# Patient Record
Sex: Female | Born: 1935 | Race: White | Hispanic: No | Marital: Married | Smoking: Never smoker
Health system: Southern US, Community
[De-identification: ages and names within clinical notes are randomized; demographics above are authoritative.]

## PROBLEM LIST (undated history)

## (undated) DIAGNOSIS — F419 Anxiety disorder, unspecified: Secondary | ICD-10-CM

## (undated) DIAGNOSIS — M109 Gout, unspecified: Secondary | ICD-10-CM

## (undated) DIAGNOSIS — G629 Polyneuropathy, unspecified: Secondary | ICD-10-CM

## (undated) DIAGNOSIS — I10 Essential (primary) hypertension: Secondary | ICD-10-CM

## (undated) DIAGNOSIS — M1990 Unspecified osteoarthritis, unspecified site: Secondary | ICD-10-CM

## (undated) HISTORY — PX: FOOT BONE EXCISION: SUR493

## (undated) HISTORY — PX: ABDOMINAL HYSTERECTOMY: SHX81

## (undated) HISTORY — PX: JOINT REPLACEMENT: SHX530

## (undated) HISTORY — PX: BACK SURGERY: SHX140

## (undated) HISTORY — DX: Polyneuropathy, unspecified: G62.9

---

## 1999-12-20 ENCOUNTER — Ambulatory Visit (HOSPITAL_BASED_OUTPATIENT_CLINIC_OR_DEPARTMENT_OTHER): Admission: RE | Admit: 1999-12-20 | Discharge: 1999-12-20 | Payer: Self-pay | Admitting: Orthopedic Surgery

## 2012-10-28 ENCOUNTER — Other Ambulatory Visit: Payer: Self-pay | Admitting: *Deleted

## 2012-10-28 DIAGNOSIS — M79609 Pain in unspecified limb: Secondary | ICD-10-CM

## 2012-10-28 DIAGNOSIS — I83893 Varicose veins of bilateral lower extremities with other complications: Secondary | ICD-10-CM

## 2012-10-30 ENCOUNTER — Encounter: Payer: Self-pay | Admitting: Surgery

## 2012-11-29 ENCOUNTER — Encounter: Payer: Self-pay | Admitting: Surgery

## 2012-12-02 ENCOUNTER — Encounter: Payer: Self-pay | Admitting: Surgery

## 2012-12-02 ENCOUNTER — Ambulatory Visit (HOSPITAL_COMMUNITY)
Admission: RE | Admit: 2012-12-02 | Discharge: 2012-12-02 | Disposition: A | Discharge status: Home / Self Care | Payer: Medicare Other | Source: Ambulatory Visit | Attending: Surgery | Admitting: Surgery

## 2012-12-02 ENCOUNTER — Ambulatory Visit (INDEPENDENT_AMBULATORY_CARE_PROVIDER_SITE_OTHER): Payer: Medicare Other | Admitting: Surgery

## 2012-12-02 VITALS — BP 175/72 | HR 56 | Ht 63.0 in | Wt 198.0 lb

## 2012-12-02 DIAGNOSIS — M79609 Pain in unspecified limb: Secondary | ICD-10-CM

## 2012-12-02 DIAGNOSIS — I83893 Varicose veins of bilateral lower extremities with other complications: Secondary | ICD-10-CM

## 2012-12-02 NOTE — Progress Notes (Signed)
[facility] [place]   Patient name: Jaclyn Horton MRN: [id number] DOB: [birth year] Sex: female   Referred by: Dr. Renae Fickle  Reason for referral:  Chief Complaint  Patient presents with  . Varicose Veins    new pt, vv's with venous stasis changes    HISTORY OF PRESENT ILLNESS: This is a very pleasant 79 year old gentleman who is referred for bilateral leg pain and swelling.  She states that this has been going on for many years.  She does have multiple orthopedic issues second tributary pain.  She complains of varicose veins in both legs.  She also complains of swelling.  She has trouble walking to the mailbox.  The patient also suffers from neuropathy.  She is multiple orthopedic lower extremity issues.  She has never had a blood clot.  Past Medical History  Diagnosis Date  . Neuropathy     History reviewed. No pertinent past surgical history.  History   Social History  . Marital Status: Married    Spouse Name: N/A    Number of Children: N/A  . Years of Education: N/A   Occupational History  . Not on file.   Social History Main Topics  . Smoking status: Never Smoker   . Smokeless tobacco: Never Used  . Alcohol Use: No  . Drug Use: No  . Sexual Activity: Not on file   Other Topics Concern  . Not on file   Social History Narrative  . No narrative on file    Family History  Problem Relation Age of Onset  . Cancer Mother   . Cancer Father   . Hypertension Sister   . Hyperlipidemia Sister   . Cancer Brother   . Heart attack Brother   . Diabetes Son     Allergies as of 12/02/2012  . (No Known Allergies)    No current outpatient prescriptions on file prior to visit.   No current facility-administered medications on file prior to visit.     REVIEW OF SYSTEMS: Cardiovascular: Positive for pain in legs with walking and lying flat.  Positive for leg swelling and are as veins. Pulmonary: No productive cough, asthma or wheezing. Neurologic:  Positive for weakness in her legs as well as numbness Hematologic: No bleeding problems or clotting disorders. Musculoskeletal: No joint pain or joint swelling. Gastrointestinal: No blood in stool or hematemesis Genitourinary: No dysuria or hematuria. Psychiatric:: No history of major depression. Integumentary: Positive for a rash Constitutional: No fever or chills.  PHYSICAL EXAMINATION: General: The patient appears their stated age.  Vital signs are BP 175/72  Pulse 56  Ht 5\' 3"  (1.6 m)  Wt 198 lb (89.812 kg)  BMI 35.08 kg/m2  SpO2 100% HEENT:  No gross abnormalities Pulmonary: Respirations are non-labored Musculoskeletal: There are no major deformities.   Neurologic: No focal weakness or paresthesias are detected, Skin: There are no ulcer or rashes noted. Psychiatric: The patient has normal affect. Cardiovascular: Palpable pedal pulses bilaterally.  Small varicosities bilaterally.  1+ pitting edema.  Diagnostic Studies: Venous reflux evaluation was performed today.  This was positive for reflux at the saphenofemoral junction bilaterally.  Her great saphenous vein was competent bilaterally.  There was reflux in bilateral short saphenous veins, however the diameter measurements were very small.  The patient also has deep vein reflux with no evidence of obstruction.   Assessment:  Bilateral leg swelling Plan: Base of the patient's ultrasound today, I feel her symptoms are secondary to deep vein insufficiency.  She  does not have incontinence in her great saphenous vein.  There is incompetence of the short saphenous vein however the vein diameter measurements are too small for intervention.  I discussed this with the patient.  I have recommended placing her into 20-30 mm compression stockings for symptom relief.  I do not feel that her symptoms of pain in her feet with walking are related to peripheral vascular disease that she has palpable pulses.  I did discuss with the patient that if  her varicosities become tender or if she has episodes of bleeding that she would be a candidate for stab phlebectomy to have them removed.  This is considered a cosmetic procedure.  The patient will return on an as-needed basis.     Jorge Ny, M.D. [facility] [place] Office: [phone] Pager:  [phone]

## 2012-12-24 ENCOUNTER — Other Ambulatory Visit: Payer: Self-pay | Admitting: Neurosurgery

## 2012-12-24 DIAGNOSIS — M5430 Sciatica, unspecified side: Secondary | ICD-10-CM

## 2012-12-24 DIAGNOSIS — M545 Low back pain, unspecified: Secondary | ICD-10-CM

## 2012-12-25 ENCOUNTER — Other Ambulatory Visit: Payer: No Typology Code available for payment source

## 2012-12-25 ENCOUNTER — Ambulatory Visit
Admission: RE | Admit: 2012-12-25 | Discharge: 2012-12-25 | Disposition: A | Discharge status: Home / Self Care | Payer: Medicare Other | Source: Ambulatory Visit | Attending: Neurosurgery | Admitting: Neurosurgery

## 2012-12-25 DIAGNOSIS — M545 Low back pain: Secondary | ICD-10-CM

## 2012-12-25 DIAGNOSIS — M5430 Sciatica, unspecified side: Secondary | ICD-10-CM

## 2012-12-25 MED ORDER — METHYLPREDNISOLONE ACETATE 40 MG/ML INJ SUSP (RADIOLOG
120.0000 mg | Freq: Once | INTRAMUSCULAR | Status: AC
  Administered 2012-12-25: 120 mg via EPIDURAL

## 2012-12-25 MED ORDER — IOHEXOL 180 MG/ML  SOLN
1.0000 mL | Freq: Once | INTRAMUSCULAR | Status: AC | PRN
  Administered 2012-12-25: 1 mL via EPIDURAL

## 2013-01-28 ENCOUNTER — Other Ambulatory Visit: Payer: Self-pay | Admitting: Neurosurgery

## 2013-01-28 DIAGNOSIS — M5126 Other intervertebral disc displacement, lumbar region: Secondary | ICD-10-CM

## 2013-01-29 ENCOUNTER — Other Ambulatory Visit: Payer: No Typology Code available for payment source

## 2013-01-31 ENCOUNTER — Ambulatory Visit
Admission: RE | Admit: 2013-01-31 | Discharge: 2013-01-31 | Disposition: A | Discharge status: Home / Self Care | Payer: Medicare Other | Source: Ambulatory Visit | Attending: Neurosurgery | Admitting: Neurosurgery

## 2013-01-31 DIAGNOSIS — M5126 Other intervertebral disc displacement, lumbar region: Secondary | ICD-10-CM

## 2013-01-31 MED ORDER — IOHEXOL 180 MG/ML  SOLN
1.0000 mL | Freq: Once | INTRAMUSCULAR | Status: AC | PRN
  Administered 2013-01-31: 1 mL via EPIDURAL

## 2013-01-31 MED ORDER — METHYLPREDNISOLONE ACETATE 40 MG/ML INJ SUSP (RADIOLOG
120.0000 mg | Freq: Once | INTRAMUSCULAR | Status: AC
  Administered 2013-01-31: 120 mg via EPIDURAL

## 2013-04-08 ENCOUNTER — Other Ambulatory Visit: Payer: Self-pay | Admitting: Neurosurgery

## 2013-04-08 DIAGNOSIS — M545 Low back pain, unspecified: Secondary | ICD-10-CM

## 2013-04-10 ENCOUNTER — Ambulatory Visit
Admission: RE | Admit: 2013-04-10 | Discharge: 2013-04-10 | Disposition: A | Discharge status: Home / Self Care | Payer: Medicare Other | Source: Ambulatory Visit | Attending: Neurosurgery | Admitting: Neurosurgery

## 2013-04-10 DIAGNOSIS — M545 Low back pain, unspecified: Secondary | ICD-10-CM

## 2013-04-10 MED ORDER — METHYLPREDNISOLONE ACETATE 40 MG/ML INJ SUSP (RADIOLOG
120.0000 mg | Freq: Once | INTRAMUSCULAR | Status: AC
  Administered 2013-04-10: 120 mg via EPIDURAL

## 2013-04-10 MED ORDER — IOHEXOL 180 MG/ML  SOLN
1.0000 mL | Freq: Once | INTRAMUSCULAR | Status: AC | PRN
  Administered 2013-04-10: 1 mL via EPIDURAL

## 2013-11-06 ENCOUNTER — Other Ambulatory Visit: Payer: Self-pay | Admitting: Neurosurgery

## 2013-11-14 ENCOUNTER — Encounter (HOSPITAL_COMMUNITY): Payer: Self-pay | Admitting: Pharmacy Technician

## 2013-11-25 ENCOUNTER — Encounter (HOSPITAL_COMMUNITY): Payer: Self-pay

## 2013-11-25 ENCOUNTER — Encounter (HOSPITAL_COMMUNITY)
Admission: RE | Admit: 2013-11-25 | Discharge: 2013-11-25 | Disposition: A | Discharge status: Home / Self Care | Payer: Medicare Other | Source: Ambulatory Visit | Attending: Neurosurgery | Admitting: Neurosurgery

## 2013-11-25 DIAGNOSIS — M1990 Unspecified osteoarthritis, unspecified site: Secondary | ICD-10-CM | POA: Insufficient documentation

## 2013-11-25 DIAGNOSIS — Z90710 Acquired absence of both cervix and uterus: Secondary | ICD-10-CM | POA: Insufficient documentation

## 2013-11-25 DIAGNOSIS — F419 Anxiety disorder, unspecified: Secondary | ICD-10-CM | POA: Diagnosis not present

## 2013-11-25 DIAGNOSIS — I10 Essential (primary) hypertension: Secondary | ICD-10-CM | POA: Insufficient documentation

## 2013-11-25 DIAGNOSIS — I272 Other secondary pulmonary hypertension: Secondary | ICD-10-CM | POA: Diagnosis not present

## 2013-11-25 DIAGNOSIS — M109 Gout, unspecified: Secondary | ICD-10-CM | POA: Insufficient documentation

## 2013-11-25 DIAGNOSIS — I071 Rheumatic tricuspid insufficiency: Secondary | ICD-10-CM | POA: Insufficient documentation

## 2013-11-25 DIAGNOSIS — Z96612 Presence of left artificial shoulder joint: Secondary | ICD-10-CM | POA: Insufficient documentation

## 2013-11-25 DIAGNOSIS — I340 Nonrheumatic mitral (valve) insufficiency: Secondary | ICD-10-CM | POA: Diagnosis not present

## 2013-11-25 DIAGNOSIS — I351 Nonrheumatic aortic (valve) insufficiency: Secondary | ICD-10-CM | POA: Insufficient documentation

## 2013-11-25 DIAGNOSIS — G629 Polyneuropathy, unspecified: Secondary | ICD-10-CM | POA: Insufficient documentation

## 2013-11-25 DIAGNOSIS — Z01818 Encounter for other preprocedural examination: Secondary | ICD-10-CM | POA: Diagnosis present

## 2013-11-25 DIAGNOSIS — I517 Cardiomegaly: Secondary | ICD-10-CM | POA: Diagnosis not present

## 2013-11-25 DIAGNOSIS — I498 Other specified cardiac arrhythmias: Secondary | ICD-10-CM | POA: Diagnosis not present

## 2013-11-25 HISTORY — DX: Anxiety disorder, unspecified: F41.9

## 2013-11-25 HISTORY — DX: Unspecified osteoarthritis, unspecified site: M19.90

## 2013-11-25 HISTORY — DX: Gout, unspecified: M10.9

## 2013-11-25 HISTORY — DX: Essential (primary) hypertension: I10

## 2013-11-25 LAB — CBC
HCT: 37.7 % (ref 36.0–46.0)
HEMOGLOBIN: 13.2 g/dL (ref 12.0–15.0)
MCH: 31.7 pg (ref 26.0–34.0)
MCHC: 35 g/dL (ref 30.0–36.0)
MCV: 90.6 fL (ref 78.0–100.0)
PLATELETS: 203 10*3/uL (ref 150–400)
RBC: 4.16 MIL/uL (ref 3.87–5.11)
RDW: 13.1 % (ref 11.5–15.5)
WBC: 5.8 10*3/uL (ref 4.0–10.5)

## 2013-11-25 LAB — SURGICAL PCR SCREEN
MRSA, PCR: NEGATIVE
Staphylococcus aureus: POSITIVE — AB

## 2013-11-25 LAB — BASIC METABOLIC PANEL
ANION GAP: 11 (ref 5–15)
BUN: 11 mg/dL (ref 6–23)
CALCIUM: 10.1 mg/dL (ref 8.4–10.5)
CHLORIDE: 98 meq/L (ref 96–112)
CO2: 32 mEq/L (ref 19–32)
CREATININE: 0.74 mg/dL (ref 0.50–1.10)
GFR, EST AFRICAN AMERICAN: 89 mL/min — AB (ref >90)
GFR, EST NON AFRICAN AMERICAN: 77 mL/min — AB (ref >90)
Glucose, Bld: 104 mg/dL — ABNORMAL HIGH (ref 70–99)
Potassium: 2.9 mEq/L — CL (ref 3.7–5.3)
Sodium: 141 mEq/L (ref 137–147)

## 2013-11-25 NOTE — Progress Notes (Signed)
Dr Wynetta Emerycram notified of K 2.9. Will F/U with allison also.

## 2013-11-25 NOTE — Pre-Procedure Instructions (Signed)
Jaclyn Horton  11/25/2013   Your procedure is scheduled on:  12/01/13  [name].  Call this number if you have problems the morning of surgery: [phone]   Remember:   Do not eat food or drink liquids after midnight.   Take these medicines the morning of surgery with A SIP OF WATER: allopurinol,xanax,tenoretic,neruontin   Do not wear jewelry, make-up or nail polish.  Do not wear lotions, powders, or perfumes. You may wear deodorant.  Do not shave 48 hours prior to surgery. Men may shave face and neck.  Do not bring valuables to the hospital.  [facility] is not responsible                  for any belongings or valuables.               Contacts, dentures or bridgework may not be worn into surgery.  Leave suitcase in the car. After surgery it may be brought to your room.  For patients admitted to the hospital, discharge time is determined by your                treatment team.               Patients discharged the day of surgery will not be allowed to drive  home.  Name and phone number of your driver: family  Special Instructions: Incentive Spirometry - Practice and bring it with you on the day of surgery.   Please read over the following fact sheets that you were given: Pain Booklet, Coughing and Deep Breathing, MRSA Information and Surgical Site Infection Prevention

## 2013-11-26 NOTE — Progress Notes (Addendum)
Anesthesia Chart Review:  Patient is a 80 year old female scheduled for microdiscectomy L4-5, L5-S1, right on 12/01/13 by Dr. Wynetta Emeryram.  History includes HTN, anxiety, arthritis, neuropathy, gout, hysterectomy, left shoulder replacement.  PCP is listed as Dr. Barney DrainMoogali Arvind, records pending.    Meds: Xanax, allopurinol, amitriptyline, Tenoretic, gabapentin, hydroxyzine, KCL (20 mEq BID), Vitamin B6 and B12.  EKG on 12/05/13 showed NSR, LVH, nonspecific ST/T wave abnormality. Currently, no old tracing is available for review, but [facility] indicate that EKG on 04/12/10 showed SB with sinus arrhythmia, moderate voltage criteria for LVH, non-specific ST/T wave abnormality.   Preoperative labs noted.  CBC WNL. Glucose 104. Cr 0.74. K 2.9.  PAT RN notes indicate that result was already called to Dr. Wynetta Emeryram who is ordering additional K-dur.  She will need an ISTAT4 on arrival if her K+ has not been rechecked prior to surgery.   Velna Ochsllison Naftali Carchi, PA-C [facility]Anesthesiology Phone [phone] 11/26/2013 5:22 PM  Addendum: [facility] as well as a signed medical clearance note from Dr. Kathrynn SpeedArvind. 2015 (03/28/13 ?) [facility] to our tracing. Notes indicate that she had a stress test in 03/12/07.  Echo on 09/28/11 showed: Technically difficult study with suboptimal apical views due to body habitus. LV is normal in size, mild concentric LVH, overall normal LV systolic function, EF 60-65%, diastolic filling pattern indicates impaired relaxation.  AV is trileaflet and mildly thickened, tace AR. Mild MR. Moderate TR. Moderate pulmonary hypertension. RVSP pressures 40.43 mmHg. Trace/mild (physiologic) PR.     Her last CMET sent was from 11/07/13, so she will still need an ISTAT on arrival to re-evaluate her K+.  Velna Ochsllison Paloma Grange, PA-C [facility] Phone [phone] 11/28/2013 3:34 PM

## 2013-11-28 NOTE — Progress Notes (Signed)
Review of records rec'd [facility] with A. Zelenak,PA-C

## 2013-11-28 NOTE — Progress Notes (Signed)
2 attempts to fax, failed. [facility] , requested last OV note, cardiac records, last K+, she will be faxing to [phone] soon.

## 2013-11-30 MED ORDER — CEFAZOLIN SODIUM-DEXTROSE 2-3 GM-% IV SOLR
2.0000 g | INTRAVENOUS | Status: AC
  Administered 2013-12-01: 2 g via INTRAVENOUS
  Filled 2013-11-30: qty 50

## 2013-11-30 MED ORDER — DEXAMETHASONE SODIUM PHOSPHATE 10 MG/ML IJ SOLN
10.0000 mg | INTRAMUSCULAR | Status: AC
  Administered 2013-12-01: 10 mg via INTRAVENOUS
  Filled 2013-11-30: qty 1

## 2013-12-01 ENCOUNTER — Inpatient Hospital Stay (HOSPITAL_COMMUNITY): Payer: Medicare Other | Admitting: Certified Registered Nurse Anesthetist

## 2013-12-01 ENCOUNTER — Inpatient Hospital Stay (HOSPITAL_COMMUNITY)
Admission: RE | Admit: 2013-12-01 | Discharge: 2013-12-02 | DRG: 520 | Disposition: A | Discharge status: Home Health | Payer: Medicare Other | Source: Ambulatory Visit | Attending: Neurosurgery | Admitting: Neurosurgery

## 2013-12-01 ENCOUNTER — Inpatient Hospital Stay (HOSPITAL_COMMUNITY): Payer: Medicare Other | Admitting: Vascular Surgery

## 2013-12-01 ENCOUNTER — Inpatient Hospital Stay (HOSPITAL_COMMUNITY): Payer: Medicare Other

## 2013-12-01 ENCOUNTER — Encounter (HOSPITAL_COMMUNITY): Payer: Self-pay | Admitting: *Deleted

## 2013-12-01 ENCOUNTER — Encounter (HOSPITAL_COMMUNITY)
Admission: RE | Disposition: A | Discharge status: Home Health | Payer: Self-pay | Source: Ambulatory Visit | Attending: Neurosurgery

## 2013-12-01 DIAGNOSIS — M109 Gout, unspecified: Secondary | ICD-10-CM | POA: Diagnosis present

## 2013-12-01 DIAGNOSIS — M48061 Spinal stenosis, lumbar region without neurogenic claudication: Secondary | ICD-10-CM | POA: Diagnosis present

## 2013-12-01 DIAGNOSIS — F419 Anxiety disorder, unspecified: Secondary | ICD-10-CM | POA: Diagnosis present

## 2013-12-01 DIAGNOSIS — I10 Essential (primary) hypertension: Secondary | ICD-10-CM | POA: Diagnosis present

## 2013-12-01 DIAGNOSIS — M47896 Other spondylosis, lumbar region: Secondary | ICD-10-CM | POA: Diagnosis present

## 2013-12-01 DIAGNOSIS — Z79899 Other long term (current) drug therapy: Secondary | ICD-10-CM | POA: Diagnosis not present

## 2013-12-01 DIAGNOSIS — G629 Polyneuropathy, unspecified: Secondary | ICD-10-CM | POA: Diagnosis present

## 2013-12-01 DIAGNOSIS — M4806 Spinal stenosis, lumbar region: Secondary | ICD-10-CM | POA: Diagnosis present

## 2013-12-01 DIAGNOSIS — IMO0002 Reserved for concepts with insufficient information to code with codable children: Secondary | ICD-10-CM

## 2013-12-01 DIAGNOSIS — M1990 Unspecified osteoarthritis, unspecified site: Secondary | ICD-10-CM | POA: Diagnosis present

## 2013-12-01 DIAGNOSIS — Z96612 Presence of left artificial shoulder joint: Secondary | ICD-10-CM | POA: Diagnosis present

## 2013-12-01 HISTORY — PX: LUMBAR LAMINECTOMY/DECOMPRESSION MICRODISCECTOMY: SHX5026

## 2013-12-01 LAB — POCT I-STAT 4, (NA,K, GLUC, HGB,HCT)
Glucose, Bld: 95 mg/dL (ref 70–99)
HCT: 40 % (ref 36.0–46.0)
Hemoglobin: 13.6 g/dL (ref 12.0–15.0)
Potassium: 3 mEq/L — ABNORMAL LOW (ref 3.7–5.3)
SODIUM: 139 meq/L (ref 137–147)

## 2013-12-01 SURGERY — LUMBAR LAMINECTOMY/DECOMPRESSION MICRODISCECTOMY 1 LEVEL
Anesthesia: General | Site: Back | Laterality: Right

## 2013-12-01 MED ORDER — GLYCOPYRROLATE 0.2 MG/ML IJ SOLN
INTRAMUSCULAR | Status: DC | PRN
  Administered 2013-12-01: 0.6 mg via INTRAVENOUS

## 2013-12-01 MED ORDER — HEMOSTATIC AGENTS (NO CHARGE) OPTIME
TOPICAL | Status: DC | PRN
  Administered 2013-12-01: 1 via TOPICAL

## 2013-12-01 MED ORDER — SODIUM CHLORIDE 0.9 % IJ SOLN
3.0000 mL | Freq: Two times a day (BID) | INTRAMUSCULAR | Status: DC
  Administered 2013-12-01: 3 mL via INTRAVENOUS

## 2013-12-01 MED ORDER — BUPIVACAINE HCL (PF) 0.25 % IJ SOLN
INTRAMUSCULAR | Status: DC | PRN
  Administered 2013-12-01: 10 mL

## 2013-12-01 MED ORDER — POTASSIUM CHLORIDE 20 MEQ PO PACK
20.0000 meq | PACK | Freq: Two times a day (BID) | ORAL | Status: DC
  Filled 2013-12-01: qty 1

## 2013-12-01 MED ORDER — VITAMIN B-12 1000 MCG PO TABS
1000.0000 ug | ORAL_TABLET | Freq: Every day | ORAL | Status: DC
  Filled 2013-12-01 (×2): qty 1

## 2013-12-01 MED ORDER — CHLORTHALIDONE 25 MG PO TABS
25.0000 mg | ORAL_TABLET | Freq: Every day | ORAL | Status: DC
  Administered 2013-12-01: 25 mg via ORAL
  Filled 2013-12-01 (×2): qty 1

## 2013-12-01 MED ORDER — DOCUSATE SODIUM 100 MG PO CAPS
100.0000 mg | ORAL_CAPSULE | Freq: Two times a day (BID) | ORAL | Status: DC
  Administered 2013-12-01: 100 mg via ORAL
  Filled 2013-12-01 (×3): qty 1

## 2013-12-01 MED ORDER — AMITRIPTYLINE HCL 25 MG PO TABS
25.0000 mg | ORAL_TABLET | Freq: Every day | ORAL | Status: DC
  Administered 2013-12-01: 25 mg via ORAL
  Filled 2013-12-01 (×2): qty 1

## 2013-12-01 MED ORDER — LACTATED RINGERS IV SOLN
INTRAVENOUS | Status: DC
  Administered 2013-12-01: 13:00:00 via INTRAVENOUS

## 2013-12-01 MED ORDER — ROCURONIUM BROMIDE 50 MG/5ML IV SOLN
INTRAVENOUS | Status: AC
  Filled 2013-12-01: qty 1

## 2013-12-01 MED ORDER — MENTHOL 3 MG MT LOZG: 1.0000 | LOZENGE | OROMUCOSAL | Status: DC | PRN

## 2013-12-01 MED ORDER — ALUM & MAG HYDROXIDE-SIMETH 200-200-20 MG/5ML PO SUSP: 30.0000 mL | Freq: Four times a day (QID) | ORAL | Status: DC | PRN

## 2013-12-01 MED ORDER — HYDROMORPHONE HCL 1 MG/ML IJ SOLN: 0.2500 mg | INTRAMUSCULAR | Status: DC | PRN

## 2013-12-01 MED ORDER — HYDROXYZINE HCL 25 MG PO TABS: 50.0000 mg | ORAL_TABLET | Freq: Three times a day (TID) | ORAL | Status: DC | PRN

## 2013-12-01 MED ORDER — ACETAMINOPHEN 650 MG RE SUPP: 650.0000 mg | RECTAL | Status: DC | PRN

## 2013-12-01 MED ORDER — ATENOLOL 100 MG PO TABS
100.0000 mg | ORAL_TABLET | Freq: Every day | ORAL | Status: DC
  Filled 2013-12-01 (×2): qty 1

## 2013-12-01 MED ORDER — LIDOCAINE-EPINEPHRINE 1 %-1:100000 IJ SOLN
INTRAMUSCULAR | Status: DC | PRN
  Administered 2013-12-01: 10 mL

## 2013-12-01 MED ORDER — SODIUM CHLORIDE 0.9 % IJ SOLN: 3.0000 mL | INTRAMUSCULAR | Status: DC | PRN

## 2013-12-01 MED ORDER — GABAPENTIN 100 MG PO CAPS
100.0000 mg | ORAL_CAPSULE | Freq: Three times a day (TID) | ORAL | Status: DC
  Administered 2013-12-01: 100 mg via ORAL
  Filled 2013-12-01 (×5): qty 1

## 2013-12-01 MED ORDER — LACTATED RINGERS IV SOLN
INTRAVENOUS | Status: DC | PRN
  Administered 2013-12-01 (×2): via INTRAVENOUS

## 2013-12-01 MED ORDER — OXYCODONE HCL 5 MG/5ML PO SOLN: 5.0000 mg | Freq: Once | ORAL | Status: DC | PRN

## 2013-12-01 MED ORDER — ONDANSETRON HCL 4 MG/2ML IJ SOLN: 4.0000 mg | INTRAMUSCULAR | Status: DC | PRN

## 2013-12-01 MED ORDER — ATENOLOL-CHLORTHALIDONE 100-25 MG PO TABS: 1.0000 | ORAL_TABLET | Freq: Every day | ORAL | Status: DC

## 2013-12-01 MED ORDER — POTASSIUM CHLORIDE CRYS ER 20 MEQ PO TBCR
20.0000 meq | EXTENDED_RELEASE_TABLET | Freq: Two times a day (BID) | ORAL | Status: DC
  Administered 2013-12-01: 20 meq via ORAL
  Filled 2013-12-01 (×3): qty 1

## 2013-12-01 MED ORDER — ARTIFICIAL TEARS OP OINT
TOPICAL_OINTMENT | OPHTHALMIC | Status: AC
  Filled 2013-12-01: qty 3.5

## 2013-12-01 MED ORDER — GLYCOPYRROLATE 0.2 MG/ML IJ SOLN
INTRAMUSCULAR | Status: AC
  Filled 2013-12-01: qty 3

## 2013-12-01 MED ORDER — LIDOCAINE HCL (CARDIAC) 20 MG/ML IV SOLN
INTRAVENOUS | Status: DC | PRN
  Administered 2013-12-01: 100 mg via INTRAVENOUS

## 2013-12-01 MED ORDER — ARTIFICIAL TEARS OP OINT
TOPICAL_OINTMENT | OPHTHALMIC | Status: DC | PRN
  Administered 2013-12-01: 1 via OPHTHALMIC

## 2013-12-01 MED ORDER — PROPOFOL 10 MG/ML IV BOLUS
INTRAVENOUS | Status: DC | PRN
  Administered 2013-12-01: 100 mg via INTRAVENOUS

## 2013-12-01 MED ORDER — PROMETHAZINE HCL 25 MG/ML IJ SOLN: 6.2500 mg | INTRAMUSCULAR | Status: DC | PRN

## 2013-12-01 MED ORDER — ONDANSETRON HCL 4 MG/2ML IJ SOLN
INTRAMUSCULAR | Status: AC
  Filled 2013-12-01: qty 2

## 2013-12-01 MED ORDER — OXYCODONE HCL 5 MG PO TABS: 5.0000 mg | ORAL_TABLET | Freq: Once | ORAL | Status: DC | PRN

## 2013-12-01 MED ORDER — SENNA 8.6 MG PO TABS
1.0000 | ORAL_TABLET | Freq: Two times a day (BID) | ORAL | Status: DC
  Administered 2013-12-01: 8.6 mg via ORAL
  Filled 2013-12-01 (×3): qty 1

## 2013-12-01 MED ORDER — CEFAZOLIN SODIUM 1-5 GM-% IV SOLN
1.0000 g | Freq: Three times a day (TID) | INTRAVENOUS | Status: DC
  Administered 2013-12-01 – 2013-12-02 (×2): 1 g via INTRAVENOUS
  Filled 2013-12-01 (×4): qty 50

## 2013-12-01 MED ORDER — 0.9 % SODIUM CHLORIDE (POUR BTL) OPTIME
TOPICAL | Status: DC | PRN
  Administered 2013-12-01: 1000 mL

## 2013-12-01 MED ORDER — VITAMIN B-6 100 MG PO TABS
100.0000 mg | ORAL_TABLET | Freq: Every day | ORAL | Status: DC
  Filled 2013-12-01 (×2): qty 1

## 2013-12-01 MED ORDER — ONDANSETRON HCL 4 MG/2ML IJ SOLN
INTRAMUSCULAR | Status: DC | PRN
  Administered 2013-12-01: 4 mg via INTRAVENOUS

## 2013-12-01 MED ORDER — ACETAMINOPHEN 325 MG PO TABS: 650.0000 mg | ORAL_TABLET | ORAL | Status: DC | PRN

## 2013-12-01 MED ORDER — NEOSTIGMINE METHYLSULFATE 10 MG/10ML IV SOLN
INTRAVENOUS | Status: DC | PRN
  Administered 2013-12-01: 4 mg via INTRAVENOUS

## 2013-12-01 MED ORDER — HYDROMORPHONE HCL 1 MG/ML IJ SOLN
0.5000 mg | INTRAMUSCULAR | Status: DC | PRN
  Administered 2013-12-02: 1 mg via INTRAVENOUS
  Filled 2013-12-01: qty 1

## 2013-12-01 MED ORDER — THROMBIN 5000 UNITS EX SOLR
CUTANEOUS | Status: DC | PRN
  Administered 2013-12-01 (×2): 5000 [IU] via TOPICAL

## 2013-12-01 MED ORDER — ROCURONIUM BROMIDE 100 MG/10ML IV SOLN
INTRAVENOUS | Status: DC | PRN
  Administered 2013-12-01: 50 mg via INTRAVENOUS

## 2013-12-01 MED ORDER — ALLOPURINOL 100 MG PO TABS
100.0000 mg | ORAL_TABLET | Freq: Every day | ORAL | Status: DC
  Filled 2013-12-01: qty 1

## 2013-12-01 MED ORDER — PHENOL 1.4 % MT LIQD
1.0000 | OROMUCOSAL | Status: DC | PRN
  Administered 2013-12-02: 1 via OROMUCOSAL
  Filled 2013-12-01: qty 177

## 2013-12-01 MED ORDER — BACITRACIN 50000 UNITS IM SOLR
INTRAMUSCULAR | Status: DC | PRN
  Administered 2013-12-01: 15:00:00

## 2013-12-01 MED ORDER — FENTANYL CITRATE 0.05 MG/ML IJ SOLN
INTRAMUSCULAR | Status: DC | PRN
  Administered 2013-12-01: 100 ug via INTRAVENOUS

## 2013-12-01 MED ORDER — CYCLOBENZAPRINE HCL 10 MG PO TABS
10.0000 mg | ORAL_TABLET | Freq: Three times a day (TID) | ORAL | Status: DC | PRN
  Administered 2013-12-02: 10 mg via ORAL
  Filled 2013-12-01: qty 1

## 2013-12-01 MED ORDER — EPHEDRINE SULFATE 50 MG/ML IJ SOLN
INTRAMUSCULAR | Status: DC | PRN
  Administered 2013-12-01 (×3): 10 mg via INTRAVENOUS

## 2013-12-01 MED ORDER — LIDOCAINE HCL (CARDIAC) 20 MG/ML IV SOLN
INTRAVENOUS | Status: AC
  Filled 2013-12-01: qty 5

## 2013-12-01 MED ORDER — FENTANYL CITRATE 0.05 MG/ML IJ SOLN
INTRAMUSCULAR | Status: AC
  Filled 2013-12-01: qty 5

## 2013-12-01 MED ORDER — NEOSTIGMINE METHYLSULFATE 10 MG/10ML IV SOLN
INTRAVENOUS | Status: AC
  Filled 2013-12-01: qty 1

## 2013-12-01 MED ORDER — DEXTROSE 5 % IV SOLN: 10.0000 mg | INTRAVENOUS | Status: DC | PRN

## 2013-12-01 SURGICAL SUPPLY — 59 items
BAG DECANTER FOR FLEXI CONT (MISCELLANEOUS) ×3 IMPLANT
BENZOIN TINCTURE PRP APPL 2/3 (GAUZE/BANDAGES/DRESSINGS) ×3 IMPLANT
BLADE CLIPPER SURG (BLADE) IMPLANT
BLADE SURG 11 STRL SS (BLADE) ×3 IMPLANT
BRUSH SCRUB EZ PLAIN DRY (MISCELLANEOUS) ×3 IMPLANT
BUR MATCHSTICK NEURO 3.0 LAGG (BURR) ×3 IMPLANT
BUR PRECISION FLUTE 6.0 (BURR) ×3 IMPLANT
CANISTER SUCT 3000ML (MISCELLANEOUS) ×3 IMPLANT
CLOSURE WOUND 1/2 X4 (GAUZE/BANDAGES/DRESSINGS) ×1
CONT SPEC 4OZ CLIKSEAL STRL BL (MISCELLANEOUS) ×3 IMPLANT
DECANTER SPIKE VIAL GLASS SM (MISCELLANEOUS) ×3 IMPLANT
DRAPE LAPAROTOMY 100X72X124 (DRAPES) ×3 IMPLANT
DRAPE MICROSCOPE LEICA (MISCELLANEOUS) ×3 IMPLANT
DRAPE POUCH INSTRU U-SHP 10X18 (DRAPES) ×3 IMPLANT
DRAPE PROXIMA HALF (DRAPES) IMPLANT
DRAPE SURG 17X23 STRL (DRAPES) ×3 IMPLANT
DRSG OPSITE 4X5.5 SM (GAUZE/BANDAGES/DRESSINGS) ×3 IMPLANT
DRSG OPSITE POSTOP 4X6 (GAUZE/BANDAGES/DRESSINGS) ×3 IMPLANT
DURAPREP 26ML APPLICATOR (WOUND CARE) ×3 IMPLANT
ELECT REM PT RETURN 9FT ADLT (ELECTROSURGICAL) ×3
ELECTRODE REM PT RTRN 9FT ADLT (ELECTROSURGICAL) ×1 IMPLANT
EVACUATOR 1/8 PVC DRAIN (DRAIN) ×3 IMPLANT
GAUZE SPONGE 4X4 12PLY STRL (GAUZE/BANDAGES/DRESSINGS) ×3 IMPLANT
GAUZE SPONGE 4X4 16PLY XRAY LF (GAUZE/BANDAGES/DRESSINGS) IMPLANT
GLOVE BIO SURGEON STRL SZ8 (GLOVE) ×3 IMPLANT
GLOVE BIOGEL PI IND STRL 7.5 (GLOVE) ×3 IMPLANT
GLOVE BIOGEL PI IND STRL 8 (GLOVE) ×1 IMPLANT
GLOVE BIOGEL PI INDICATOR 7.5 (GLOVE) ×6
GLOVE BIOGEL PI INDICATOR 8 (GLOVE) ×2
GLOVE ECLIPSE 7.5 STRL STRAW (GLOVE) ×6 IMPLANT
GLOVE ECLIPSE 8.0 STRL XLNG CF (GLOVE) ×3 IMPLANT
GLOVE EXAM NITRILE LRG STRL (GLOVE) IMPLANT
GLOVE EXAM NITRILE MD LF STRL (GLOVE) IMPLANT
GLOVE EXAM NITRILE XL STR (GLOVE) IMPLANT
GLOVE EXAM NITRILE XS STR PU (GLOVE) IMPLANT
GLOVE INDICATOR 8.5 STRL (GLOVE) ×3 IMPLANT
GOWN STRL REUS W/ TWL LRG LVL3 (GOWN DISPOSABLE) ×1 IMPLANT
GOWN STRL REUS W/ TWL XL LVL3 (GOWN DISPOSABLE) ×5 IMPLANT
GOWN STRL REUS W/TWL 2XL LVL3 (GOWN DISPOSABLE) IMPLANT
GOWN STRL REUS W/TWL LRG LVL3 (GOWN DISPOSABLE) ×2
GOWN STRL REUS W/TWL XL LVL3 (GOWN DISPOSABLE) ×10
KIT BASIN OR (CUSTOM PROCEDURE TRAY) ×3 IMPLANT
KIT ROOM TURNOVER OR (KITS) ×3 IMPLANT
LIQUID BAND (GAUZE/BANDAGES/DRESSINGS) ×3 IMPLANT
NEEDLE HYPO 22GX1.5 SAFETY (NEEDLE) ×3 IMPLANT
NEEDLE SPNL 22GX3.5 QUINCKE BK (NEEDLE) ×3 IMPLANT
NS IRRIG 1000ML POUR BTL (IV SOLUTION) ×3 IMPLANT
PACK LAMINECTOMY NEURO (CUSTOM PROCEDURE TRAY) ×3 IMPLANT
RUBBERBAND STERILE (MISCELLANEOUS) ×6 IMPLANT
SPONGE SURGIFOAM ABS GEL SZ50 (HEMOSTASIS) ×3 IMPLANT
STRIP CLOSURE SKIN 1/2X4 (GAUZE/BANDAGES/DRESSINGS) ×2 IMPLANT
SUT VIC AB 0 CT1 18XCR BRD8 (SUTURE) ×1 IMPLANT
SUT VIC AB 0 CT1 8-18 (SUTURE) ×2
SUT VIC AB 2-0 CT1 18 (SUTURE) ×3 IMPLANT
SUT VICRYL 4-0 PS2 18IN ABS (SUTURE) ×3 IMPLANT
SYR 20ML ECCENTRIC (SYRINGE) ×3 IMPLANT
TOWEL OR 17X24 6PK STRL BLUE (TOWEL DISPOSABLE) ×3 IMPLANT
TOWEL OR 17X26 10 PK STRL BLUE (TOWEL DISPOSABLE) ×3 IMPLANT
WATER STERILE IRR 1000ML POUR (IV SOLUTION) ×3 IMPLANT

## 2013-12-01 NOTE — Plan of Care (Signed)
Problem: Consults Goal: Spinal Surgery Patient Education See Patient Education Module for education specifics. Outcome: Completed/Met Date Met:  12/01/13     

## 2013-12-01 NOTE — Plan of Care (Signed)
Problem: Consults Goal: Diagnosis - Spinal Surgery Outcome: Completed/Met Date Met:  12/01/13 Lumbar Laminectomy (Complex)     

## 2013-12-01 NOTE — Anesthesia Preprocedure Evaluation (Signed)
Anesthesia Evaluation  Patient identified by MRN, date of birth, ID band Patient awake    Reviewed: Allergy & Precautions, H&P , NPO status , Patient's Chart, lab work & pertinent test results  History of Anesthesia Complications Negative for: history of anesthetic complications  Airway        Dental   Pulmonary neg pulmonary ROS,          Cardiovascular hypertension, Rhythm:Regular Rate:Normal     Neuro/Psych negative neurological ROS     GI/Hepatic negative GI ROS, Neg liver ROS,   Endo/Other    Renal/GU negative Renal ROS     Musculoskeletal  (+) Arthritis -,   Abdominal   Peds  Hematology   Anesthesia Other Findings   Reproductive/Obstetrics                             Anesthesia Physical Anesthesia Plan  ASA: II  Anesthesia Plan: General   Post-op Pain Management:    Induction: Intravenous  Airway Management Planned: Oral ETT  Additional Equipment:   Intra-op Plan:   Post-operative Plan: Extubation in OR  Informed Consent: I have reviewed the patients History and Physical, chart, labs and discussed the procedure including the risks, benefits and alternatives for the proposed anesthesia with the patient or authorized representative who has indicated his/her understanding and acceptance.   Dental advisory given  Plan Discussed with: CRNA  Anesthesia Plan Comments:         Anesthesia Quick Evaluation

## 2013-12-01 NOTE — Anesthesia Postprocedure Evaluation (Signed)
  Anesthesia Post-op Note  Patient: Jaclyn Horton  Procedure(s) Performed: Procedure(s): LUMBAR LAMINECTOMY/DECOMPRESSION MICRODISCECTOMY LUMBAR FOUR/FIVE, FIVE/SACRAL ONE (Right)  Patient Location: PACU  Anesthesia Type:General  Level of Consciousness: awake and alert   Airway and Oxygen Therapy: Patient Spontanous Breathing  Post-op Pain: mild  Post-op Assessment: Post-op Vital signs reviewed  Post-op Vital Signs: stable  Last Vitals:  Filed Vitals:   12/01/13 1659  BP:   Pulse:   Temp: 36.4 C  Resp:     Complications: No apparent anesthesia complications

## 2013-12-01 NOTE — Transfer of Care (Signed)
Immediate Anesthesia Transfer of Care Note  Patient: Jaclyn Horton  Procedure(s) Performed: Procedure(s): LUMBAR LAMINECTOMY/DECOMPRESSION MICRODISCECTOMY LUMBAR FOUR/FIVE, FIVE/SACRAL ONE (Right)  Patient Location: PACU  Anesthesia Type:General  Level of Consciousness: awake, alert , oriented and patient cooperative  Airway & Oxygen Therapy: Patient Spontanous Breathing and Patient connected to nasal cannula oxygen  Post-op Assessment: Report given to PACU RN, Post -op Vital signs reviewed and stable and Patient moving all extremities X 4  Post vital signs: Reviewed and stable  Complications: No apparent anesthesia complications

## 2013-12-01 NOTE — Op Note (Signed)
Preoperative diagnosis:right L5 and S1 radiculopathy from lumbar spondylosis and spinal stenosis L4-5 and lumbar spondylosis with a disc herniation L5-S1.  Postoperative diagnosis: Same  Procedure: #1 decompressive lumbar laminectomy partial medial facetectomy and foraminotomies of the right L5 nerve root with microdissection of the right L5 nerve root  #2 lumbar limiting microdiscectomy L5-S1 with microdissection of the right S1 nerve root microscopic discectomy  Surgeon: Jillyn HiddenGary Mikhayla Phillis  Asst.: Shirlean Kellyobert Nudelman  Anesthesia: Gen.  EBL: Minimal  History of present illness: Patient is a very pleasant 80 year old female has had back and right leg pain is refractory to all forms of conservative treatment workup revealed spinal stenosis at L4-5 and spinal stenosis and a disc herniation L5-S1. Due to patient's failure conservative treatment imaging findings and progression conical syndrome I recommended a lumbar laminectomy microdiscectomy L5-S1 and a decompressive laminectomy L4-5. I extensively reviewed the risks and benefits of the operation with the patient as well as perioperative course expectations of outcome and alternatives of surgery and she understood and agreed to proceed forward.  Operative procedure: Patient brought to the OR was induced under general anesthesia positioned prone the Wilson frame her back was prepped and draped in routine sterile fashion preoperative x-ray identified the appropriate level so after infiltration of 10 mL lidocaine with epi a midline incision was made and Bovie electrocautery was used2 take down the subcutaneous tissues and subperiosteal dissections care lamina of L4-5 and L5-S1 on the right. Interoperative x-ray identified the appropriate level.the inferior aspect lamina of L4 medial facet complex super aspect of L5 was drilled down a high-speed drill a similar fashion inferior L5 medial facet complex to progress 1 was also drilled down laminotomies were begun with  a 3 mm Kerrison punch at both levels partially effected these were performed ligament flavum was removed in piecemeal fashion. Next couple illumination first working at L4-5 under biting of the medial facet complex and marching out laterally allowed identification of the L5 nerve root there was marked spondylosis and severe facet arthropathy causing compression of the proximal and dorsal aspect of the L5 nerve root this is all teased off of the dura and removed in piece of fashion marching up to the inferior aspect of the L4 foramen. There is a large spur coming off the superior aspect of the facet complex displacing the thecal sac and undersurface of the L4 nerve root this was also bitten away removed at the end of decompression was no further stenosis on either 4 foramen or the 5 foramen the disc spaces inspected and it was felt not to be herniated*left alone. This is packed with Gelfoam and attention was taken L5-S1. In a similar fashion L5-S1 was opened up under biting of the medial facet complex remove the ligament flavum and.loud indication the S1 nerve root there was a disc herniation at this level so this was incised with lumbar scalpel daily.pituitary rongeurs it was markedly spondylitic and collapsed although there was some fragments of this removed. Using Epstein curettes suture rongeurs a cell decompress the ventral aspect of thecal sac. The S1 foramen was then probed and felt to be widely patent the distal L5 foramen was also probed and felt to be patent. The wounds and copious irrigated meticulous hemostasis was maintained Gelfoam was overlaid top of the dura after copious irrigation both levels a medium Hemovac drain was placed the wounds closed in layers with interrupted Vicryl and the skin was closed running 4 subcuticular. Dermabond benzo and Steri-Strips and sterile dressings applied patient was  sent to therecovery room in stable condition.

## 2013-12-01 NOTE — H&P (Signed)
Jaclyn Horton is an 80 y.o. female.   Chief Complaint: back and right leg pain HPI: patient is a very pleasant 80 year old femal whose had progressive worsening back and right leg pain rating down to the top foot and big toe as well as the outside and bottom of her foot. Workup revealed severe lumbar spondylosis and stenosis at L4-5 and L5-S1. Patient was refractory to anti-inflammatories physical therapy epidural steroid injections. And due to her failure conservative tube progression of clinical syndrome and imaging I recommended laminectomy microdiscectomy and decompression at L4-5 and L5-S1. Also in the right. I extensively went over the risks and benefits of the operation as well as perioperative course expectations of outcome and alternatives of surgery and she understands and agrees to proceed forward.  Past Medical History  Diagnosis Date  . Neuropathy   . Hypertension   . Anxiety   . Arthritis   . Gout     Past Surgical History  Procedure Laterality Date  . Abdominal hysterectomy    . Back surgery    . Foot bone excision    . Joint replacement      lt shoulder    Family History  Problem Relation Age of Onset  . Cancer Mother   . Cancer Father   . Hypertension Sister   . Hyperlipidemia Sister   . Cancer Brother   . Heart attack Brother   . Diabetes Son    Social History:  reports that she has never smoked. She has never used smokeless tobacco. She reports that she does not drink alcohol or use illicit drugs.  Allergies:  Allergies  Allergen Reactions  . Tape     Use paper tape only    Medications Prior to Admission  Medication Sig Dispense Refill  . allopurinol (ZYLOPRIM) 100 MG tablet Take 100 mg by mouth daily.    Marland Kitchen. ALPRAZolam (XANAX) 1 MG tablet Take 1 mg by mouth at bedtime as needed for anxiety.    Marland Kitchen. amitriptyline (ELAVIL) 25 MG tablet Take 25 mg by mouth at bedtime.    Marland Kitchen. atenolol-chlorthalidone (TENORETIC) 100-25 MG per tablet Take 1 tablet by mouth daily.     Marland Kitchen. gabapentin (NEURONTIN) 100 MG capsule Take 100 mg by mouth 3 (three) times daily.    . hydrOXYzine (ATARAX/VISTARIL) 50 MG tablet Take 50 mg by mouth 3 (three) times daily as needed for itching.     . potassium chloride (KLOR-CON) 20 MEQ packet Take by mouth 2 (two) times daily.    Marland Kitchen. pyridOXINE (VITAMIN B-6) 100 MG tablet Take 100 mg by mouth daily.    . vitamin B-12 (CYANOCOBALAMIN) 1000 MCG tablet Take 1,000 mcg by mouth daily.      Results for orders placed or performed during the hospital encounter of 12/01/13 (from the past 48 hour(s))  I-STAT 4, (NA,K, GLUC, HGB,HCT)     Status: Abnormal   Collection Time: 12/01/13  1:02 PM  Result Value Ref Range   Sodium 139 137 - 147 mEq/L   Potassium 3.0 (L) 3.7 - 5.3 mEq/L   Glucose, Bld 95 70 - 99 mg/dL   HCT 16.140.0 09.636.0 - 04.546.0 %   Hemoglobin 13.6 12.0 - 15.0 g/dL   No results found.  Review of Systems  Constitutional: Negative.   HENT: Negative.   Eyes: Negative.   Respiratory: Negative.   Cardiovascular: Negative.   Gastrointestinal: Negative.   Genitourinary: Negative.   Musculoskeletal: Positive for back pain and joint pain.  Skin: Negative.  Neurological: Positive for tingling and sensory change.  Psychiatric/Behavioral: Negative.     Blood pressure 136/48, pulse 63, temperature 97.9 F (36.6 C), temperature source Oral, resp. rate 18, weight 86.637 kg (191 lb), SpO2 97 %. Physical Exam  Constitutional: She is oriented to person, place, and time. She appears well-developed and well-nourished.  HENT:  Head: Normocephalic and atraumatic.  Eyes: Pupils are equal, round, and reactive to light.  Neck: Normal range of motion.  Cardiovascular: Normal rate.   Respiratory: Effort normal and breath sounds normal.  GI: Soft. Bowel sounds are normal.  Neurological: She is alert and oriented to person, place, and time. She has normal strength. GCS eye subscore is 4. GCS verbal subscore is 5. GCS motor subscore is 6.  Reflex  Scores:      Patellar reflexes are 0 on the right side and 0 on the left side.      Achilles reflexes are 0 on the right side and 0 on the left side. Strength is 5 out of 5 in her iliopsoas, quads, and she's, gastrocs, anterior tibialis, and EHL.  Skin: Skin is warm and dry.     Assessment/Plan 80 year old female presents for an L4-5 L5-S1 decompressive laminectomy and discectomy.  Khamron Gellert P 12/01/2013, 2:02 PM

## 2013-12-01 NOTE — Anesthesia Procedure Notes (Signed)
Procedure Name: Intubation Date/Time: 12/01/2013 2:19 PM Performed by: Angelica PouSMITH, Delicia Berens PIZZICARA Pre-anesthesia Checklist: Patient identified, Timeout performed, Emergency Drugs available, Suction available and Patient being monitored Patient Re-evaluated:Patient Re-evaluated prior to inductionOxygen Delivery Method: Circle system utilized Preoxygenation: Pre-oxygenation with 100% oxygen Intubation Type: IV induction Ventilation: Mask ventilation without difficulty and Oral airway inserted - appropriate to patient size Laryngoscope Size: Mac and 3 Grade View: Grade I Tube type: Oral Tube size: 7.0 mm Number of attempts: 1 Airway Equipment and Method: Stylet Placement Confirmation: ETT inserted through vocal cords under direct vision,  breath sounds checked- equal and bilateral and positive ETCO2 Secured at: 21 cm Tube secured with: Tape Dental Injury: Teeth and Oropharynx as per pre-operative assessment

## 2013-12-02 ENCOUNTER — Encounter (HOSPITAL_COMMUNITY): Payer: Self-pay | Admitting: Neurosurgery

## 2013-12-02 MED ORDER — HYDROCODONE-ACETAMINOPHEN 5-325 MG PO TABS: 1.0000 | ORAL_TABLET | ORAL | Status: AC | PRN

## 2013-12-02 MED ORDER — HYDROCODONE-ACETAMINOPHEN 5-325 MG PO TABS: 1.0000 | ORAL_TABLET | ORAL | Status: DC | PRN

## 2013-12-02 NOTE — Plan of Care (Signed)
Problem: Phase I Progression Outcomes Goal: Pain controlled with appropriate interventions Outcome: Progressing Goal: OOB as tolerated unless otherwise ordered Outcome: Progressing Goal: Log roll for position change Outcome: Progressing Goal: Initial discharge plan identified Outcome: Progressing Goal: PT/OT consults requested Outcome: Progressing Goal: Hemodynamically stable Outcome: Progressing     

## 2013-12-02 NOTE — Progress Notes (Signed)
Patient alert and oriented, mae's well, voiding adequate amount of urine, swallowing without difficulty, no c/o pain. Patient discharged home with family. Script and discharged instructions given to patient. Patient and family stated understanding of d/c instructions given and has an appointment with MD. Aisha Zaine Elsass RN 

## 2013-12-02 NOTE — Evaluation (Signed)
Physical Therapy Evaluation Patient Details Name: Jaclyn Horton MRN: [id number] DOB: Jul 10, 1928 Today's Date: 12/02/2013   History of Present Illness  Pt admitted for L4-S1 laminectomy, discectomy due to RLE pain  Clinical Impression  Pt pleasant but with decreased awareness for precautions, safety, mobility and gait with need for cues to maintain at all times. Pt will benefit from acute as well as HHPT to maximize safety, function, independence and decrease burden of care. Pt recommend to use RW as do not feel she will safely use rollator at this time. Handout provided with education for all transfers, gait and safety provided but limited ability to demonstrate or recall when using teach back. Will continue to follow with nursing educated for assist with all mobility. Pt also reports decreased strength and OA in LLE limiting function without report of falls.     Follow Up Recommendations Home health PT;Supervision/Assistance - 24 hour    Equipment Recommendations  Rolling walker with 5" wheels    Recommendations for Other Services OT consult     Precautions / Restrictions Precautions Precautions: Fall;Back Precaution Booklet Issued: Yes (comment) Precaution Comments: handout provided with education for all precautions provided      Mobility  Bed Mobility Overal bed mobility: Needs Assistance Bed Mobility: Rolling;Sidelying to Sit Rolling: Min guard Sidelying to sit: Min assist       General bed mobility comments: cues for sequence and precautions with min assist to elevate trunk from surface  Transfers Overall transfer level: Needs assistance   Transfers: Sit to/from Stand Sit to Stand: Min assist         General transfer comment: cues for hand placement, posture and precautions with transfers  Ambulation/Gait Ambulation/Gait assistance: Min guard Ambulation Distance (Feet): 150 Feet Assistive device: Rolling walker (2 wheeled) Gait Pattern/deviations:  Step-through pattern;Decreased stride length;Trunk flexed   Gait velocity interpretation: Below normal speed for age/gender General Gait Details: cues throughout to step into RW, look up, maintain position in RW as pt with tendency to step out of RW with turns and throughout gait  Stairs            Wheelchair Mobility    Modified Rankin (Stroke Patients Only)       Balance                                             Pertinent Vitals/Pain Pain Assessment: No/denies pain    Home Living Family/patient expects to be discharged to:: Private residence Living Arrangements: Spouse/significant other Available Help at Discharge: Family;Available 24 hours/day Type of Home: House Home Access: Ramped entrance     Home Layout: One level Home Equipment: Walker - 4 wheels;Cane - quad;Wheelchair - manual      Prior Function Level of Independence: Independent               Hand Dominance        Extremity/Trunk Assessment   Upper Extremity Assessment: Generalized weakness           Lower Extremity Assessment: Generalized weakness      Cervical / Trunk Assessment: Kyphotic  Communication   Communication: HOH  Cognition Arousal/Alertness: Awake/alert Behavior During Therapy: WFL for tasks assessed/performed Overall Cognitive Status: No family/caregiver present to determine baseline cognitive functioning Area of Impairment: Memory;Safety/judgement     Memory: Decreased recall of precautions   Safety/Judgement: Decreased awareness of deficits;Decreased awareness  of safety          General Comments      Exercises        Assessment/Plan    PT Assessment Patient needs continued PT services  PT Diagnosis Difficulty walking;Generalized weakness   PT Problem List Decreased strength;Decreased cognition;Decreased activity tolerance;Decreased safety awareness;Decreased knowledge of precautions;Decreased balance;Decreased  mobility;Decreased knowledge of use of DME  PT Treatment Interventions DME instruction;Gait training;Functional mobility training;Therapeutic activities;Patient/family education   PT Goals (Current goals can be found in the Care Plan section) Acute Rehab PT Goals Patient Stated Goal: return home PT Goal Formulation: With patient Time For Goal Achievement: 12/09/13 Potential to Achieve Goals: Good    Frequency Min 5X/week   Barriers to discharge Decreased caregiver support      Co-evaluation               End of Session   Activity Tolerance: Patient tolerated treatment well Patient left: in chair;with call bell/phone within reach Nurse Communication: Mobility status;Precautions         Time: [id number] PT Time Calculation (min) (ACUTE ONLY): 24 min   Charges:   PT Evaluation $Initial PT Evaluation Tier I: 1 Procedure PT Treatments $Therapeutic Activity: 8-22 mins   PT G CodesDelorse Lek:          Tabor, Amandalynn Pitz Beth 12/02/2013, 9:29 AM Delaney MeigsMaija Tabor Rockelle Heuerman, PT [phone]

## 2013-12-02 NOTE — Evaluation (Addendum)
Occupational Therapy Evaluation Patient Details Name: Jaclyn Horton MRN: [id number] DOB: 1928-06-07 Today's Date: 12/02/2013    History of Present Illness Pt admitted for L4-S1 laminectomy, discectomy due to RLE pain   Clinical Impression   Pt s/p above. Education provided during session to pt and family. Pt planning to d/c today, so all further needs can be met in next venue of care.    Follow Up Recommendations  Home health OT;Supervision/Assistance - 24 hour    Equipment Recommendations  None recommended by OT    Recommendations for Other Services       Precautions / Restrictions Precautions Precautions: Fall;Back Precaution Booklet Issued: No Precaution Comments: educated on precautions Restrictions Weight Bearing Restrictions: No      Mobility Bed Mobility       General bed mobility comments: not assessed. OT verbally went over technique.  Transfers Overall transfer level: Needs assistance   Transfers: Sit to/from Stand Sit to Stand: Min guard         General transfer comment: Min guard for safety    Balance                                            ADL Overall ADL's : Needs assistance/impaired                 Upper Body Dressing : Min guard;Standing   Lower Body Dressing: Moderate assistance;With adaptive equipment;Sit to/from stand   Toilet Transfer: Min guard;Ambulation;RW (chair/bed)       Tub/ Shower Transfer:  (did not complete stepping over small threshold)   Functional mobility during ADLs: Min guard;Rolling walker-educated on technique for side stepping General ADL Comments: Educated on tub transfer techniques and pt attempted to step over simulated tub, but did not complete. Therapist recommended pt not stepping over tub at home and showed alternative technique. Recommended someone being with her for tub transfer. Educated on AE for LB ADLs and pt practiced and told her where she could get AE. Educated on use  of cup for teeth care and placement of grooming items to avoid breaking precautions. Discussed positioning of pillows. Educated on what pt could use for toilet hygiene as she reports this is an issue.  Educated on safety in session.     Vision                     Perception     Praxis      Pertinent Vitals/Pain Pain Assessment: No/denies pain     Hand Dominance Right   Extremity/Trunk Assessment Upper Extremity Assessment Upper Extremity Assessment: Overall WFL for tasks assessed   Lower Extremity Assessment Lower Extremity Assessment: Defer to PT evaluation   Cervical / Trunk Assessment Cervical / Trunk Assessment: Kyphotic   Communication Communication Communication: HOH   Cognition Arousal/Alertness: Awake/alert Behavior During Therapy: WFL for tasks assessed/performed Overall Cognitive Status:  (usnure of baseline) Area of Impairment: Memory;Problem solving     Memory: Decreased short-term memory;Decreased recall of precautions   Safety/Judgement: Decreased awareness of safety   Problem Solving: Slow processing;Difficulty sequencing     General Comments       Exercises       Shoulder Instructions      Home Living Family/patient expects to be discharged to:: Private residence Living Arrangements: Spouse/significant other Available Help at Discharge: Family;Available 24 hours/day Type of Home: House  Home Access: Ramped entrance     Home Layout: One level     Bathroom Shower/Tub: Teacher, early years/pre: Handicapped height     Home Equipment: Environmental consultant - 4 wheels;Ross - manual;Grab bars - toilet;Adaptive equipment;Shower Theme park manager: Reacher        Prior Functioning/Environment Level of Independence: Independent             OT Diagnosis: Acute pain   OT Problem List: Decreased strength;Decreased range of motion;Impaired balance (sitting and/or standing);Decreased knowledge of use of DME or  AE;Decreased knowledge of precautions;Decreased cognition   OT Treatment/Interventions:      OT Goals(Current goals can be found in the care plan section)   OT Frequency:     Barriers to D/C:            Co-evaluation              End of Session Equipment Utilized During Treatment: Gait belt;Rolling walker Nurse Communication: Other (comment) (recommending HHOT)  Activity Tolerance: Patient tolerated treatment well Patient left: in bed;with family/visitor present   Time: 4290-3795 OT Time Calculation (min): 28 min Charges:  OT General Charges $OT Visit: 1 Procedure OT Evaluation $Initial OT Evaluation Tier I: 1 Procedure OT Treatments $Self Care/Home Management : 8-22 mins G-CodesBenito Mccreedy OTR/L [phone] 12/02/2013, 10:23 AM

## 2013-12-02 NOTE — Discharge Summary (Signed)
  Physician Discharge Summary  Patient ID: Jaclyn Horton MRN: [id number] DOB/AGE: 04/13/1928 80 y.o.  Admit date: 12/01/2013 Discharge date: 12/02/2013  Admission Diagnoses:lumbar spinal stenosis and herniated nuclear pulposus L4-5 and L5-S1 respectively  Discharge Diagnoses: same Active Problems:   Spinal stenosis at L4-L5 level   Discharged Condition: good  Hospital Course: patient was admitted hospital underwent a decompressive laminectomy at L4-5 and a lump L5-S1 lumbar discectomy. Postoperative patient did very well with [place] floor on the floor she was angling and voiding spontaneously tolerating regular diet with significant improvement preoperative leg pain. Patient be discharged her scheduled follow-up in one to 2 weeks.  Consults: Significant Diagnostic Studies: Treatments:decompressive laminectomy L4-5 lumbar discectomy L5-S1 Discharge Exam: Blood pressure 112/52, pulse 75, temperature 98 F (36.7 C), temperature source Oral, resp. rate 18, weight 86.637 kg (191 lb), SpO2 94 %. strength out of 5 wound clean dry and intact  Disposition: home     Medication List    TAKE these medications        allopurinol 100 MG tablet  Commonly known as:  ZYLOPRIM  Take 100 mg by mouth daily.     amitriptyline 25 MG tablet  Commonly known as:  ELAVIL  Take 25 mg by mouth at bedtime.     atenolol-chlorthalidone 100-25 MG per tablet  Commonly known as:  TENORETIC  Take 1 tablet by mouth daily.     gabapentin 100 MG capsule  Commonly known as:  NEURONTIN  Take 100 mg by mouth 3 (three) times daily.     HYDROcodone-acetaminophen 5-325 MG per tablet  Commonly known as:  NORCO/VICODIN  Take 1-2 tablets by mouth every 4 (four) hours as needed for moderate pain.     hydrOXYzine 50 MG tablet  Commonly known as:  ATARAX/VISTARIL  Take 50 mg by mouth 3 (three) times daily as needed for itching.     potassium chloride 20 MEQ packet  Commonly known as:  KLOR-CON  Take by mouth  2 (two) times daily.     pyridOXINE 100 MG tablet  Commonly known as:  VITAMIN B-6  Take 100 mg by mouth daily.     vitamin B-12 1000 MCG tablet  Commonly known as:  CYANOCOBALAMIN  Take 1,000 mcg by mouth daily.      ASK your doctor about these medications        ALPRAZolam 1 MG tablet  Commonly known as:  XANAX  Take 1 mg by mouth at bedtime as needed for anxiety.           Follow-up Information    [facility].   Specialty:  Neurosurgery   Contact information:   [address] [place] [address] [phone]       Signed: Korion Cuevas P 12/02/2013, 7:42 AM

## 2013-12-02 NOTE — Discharge Instructions (Signed)
Wound Care Keep the incision clean and dry remove the outer dressing in 2 days, leave the Steri-Strips intact. Wrap with Saran wrap for showers only Do not put any creams, lotions, or ointments on incision. Leave steri-strips on back.  They will fall off by themselves.  Activity Walk each and every day, increasing distance each day. No lifting greater than 5 lbs.  No lifting no bending no twisting no driving or riding a car unless coming back and forth to see me.  Diet Resume your normal diet.   Return to Work Will be discussed at you follow up appointment.  Call Your Doctor If Any of These Occur Redness, drainage, or swelling at the wound.  Temperature greater than 101 degrees. Severe pain not relieved by pain medication. Incision starts to come apart.  Follow Up Appt Call today for appointment in 1-2 weeks ([phone]) or for problems.  If you have any hardware placed in your spine, you will need an x-ray before your appointment.   

## 2013-12-02 NOTE — Progress Notes (Signed)
Patient ID: Jaclyn Horton, female   DOB: [birth year], 80 y.o.   MRN: [id number] Patient doing well no leg pain  Awake alert and oriented neurologically nonfocal incision clean dry and intact  Discharge home

## 2013-12-02 NOTE — Progress Notes (Signed)
Utilization review completed.  

## 2013-12-02 NOTE — Care Management Note (Signed)
CARE MANAGEMENT NOTE 12/02/2013  Patient:  Jaclyn Horton,Jaclyn Horton   Account Number:  [id number]  Date Initiated:  12/02/2013  Documentation initiated by:  Vance PeperBRADY,Jasmeen Fritsch  Subjective/Objective Assessment:   80 yr old female admitted with HNP. patient had a L4-S1 laminectomy.     Action/Plan:   case manager spoke with patient concerning home health needs at discharge. Choice offered. [facility].Patient has RW. Has family support at discharge.Address confirmed with patient.   Anticipated DC Date:  12/02/2013   Anticipated DC Plan:  [facility]      DC Planning Services  CM consult      [facility]   Choice offered to / List presented to:  C-1 Patient   DME arranged  NA        HH arranged  HH-2 PT  HH-3 OT      [facility]   Status of service:  Completed, signed off Medicare Important Message given?  NA - LOS <3 / Initial given by admissions (If response is "NO", the following Medicare IM given date fields will be blank) Date Medicare IM given:   Medicare IM given by:   Date Additional Medicare IM given:   Additional Medicare IM given by:    Discharge Disposition:  [facility]  Per UR Regulation:  Reviewed for med. necessity/level of care/duration of stay

## 2014-01-09 ENCOUNTER — Ambulatory Visit (HOSPITAL_COMMUNITY)
Admission: RE | Admit: 2014-01-09 | Discharge: 2014-01-09 | Disposition: A | Discharge status: Home / Self Care | Payer: Medicare Other | Source: Ambulatory Visit | Attending: Vascular Surgery | Admitting: Vascular Surgery

## 2014-01-09 ENCOUNTER — Other Ambulatory Visit (HOSPITAL_COMMUNITY): Payer: Self-pay | Admitting: Neurosurgery

## 2014-01-09 DIAGNOSIS — M7989 Other specified soft tissue disorders: Secondary | ICD-10-CM | POA: Insufficient documentation

## 2014-01-09 DIAGNOSIS — R2243 Localized swelling, mass and lump, lower limb, bilateral: Secondary | ICD-10-CM

## 2014-01-09 DIAGNOSIS — R609 Edema, unspecified: Secondary | ICD-10-CM

## 2016-04-13 IMAGING — CR DG LUMBAR SPINE 1V
1 series · 1 of 1 positions shown · non-contrast
Comparison: 11/04/2013 MRI.

CLINICAL DATA: Herniated nucleus pulposis. Intraoperative
localization.

EXAM:
LUMBAR SPINE - 1 VIEW

[lat]
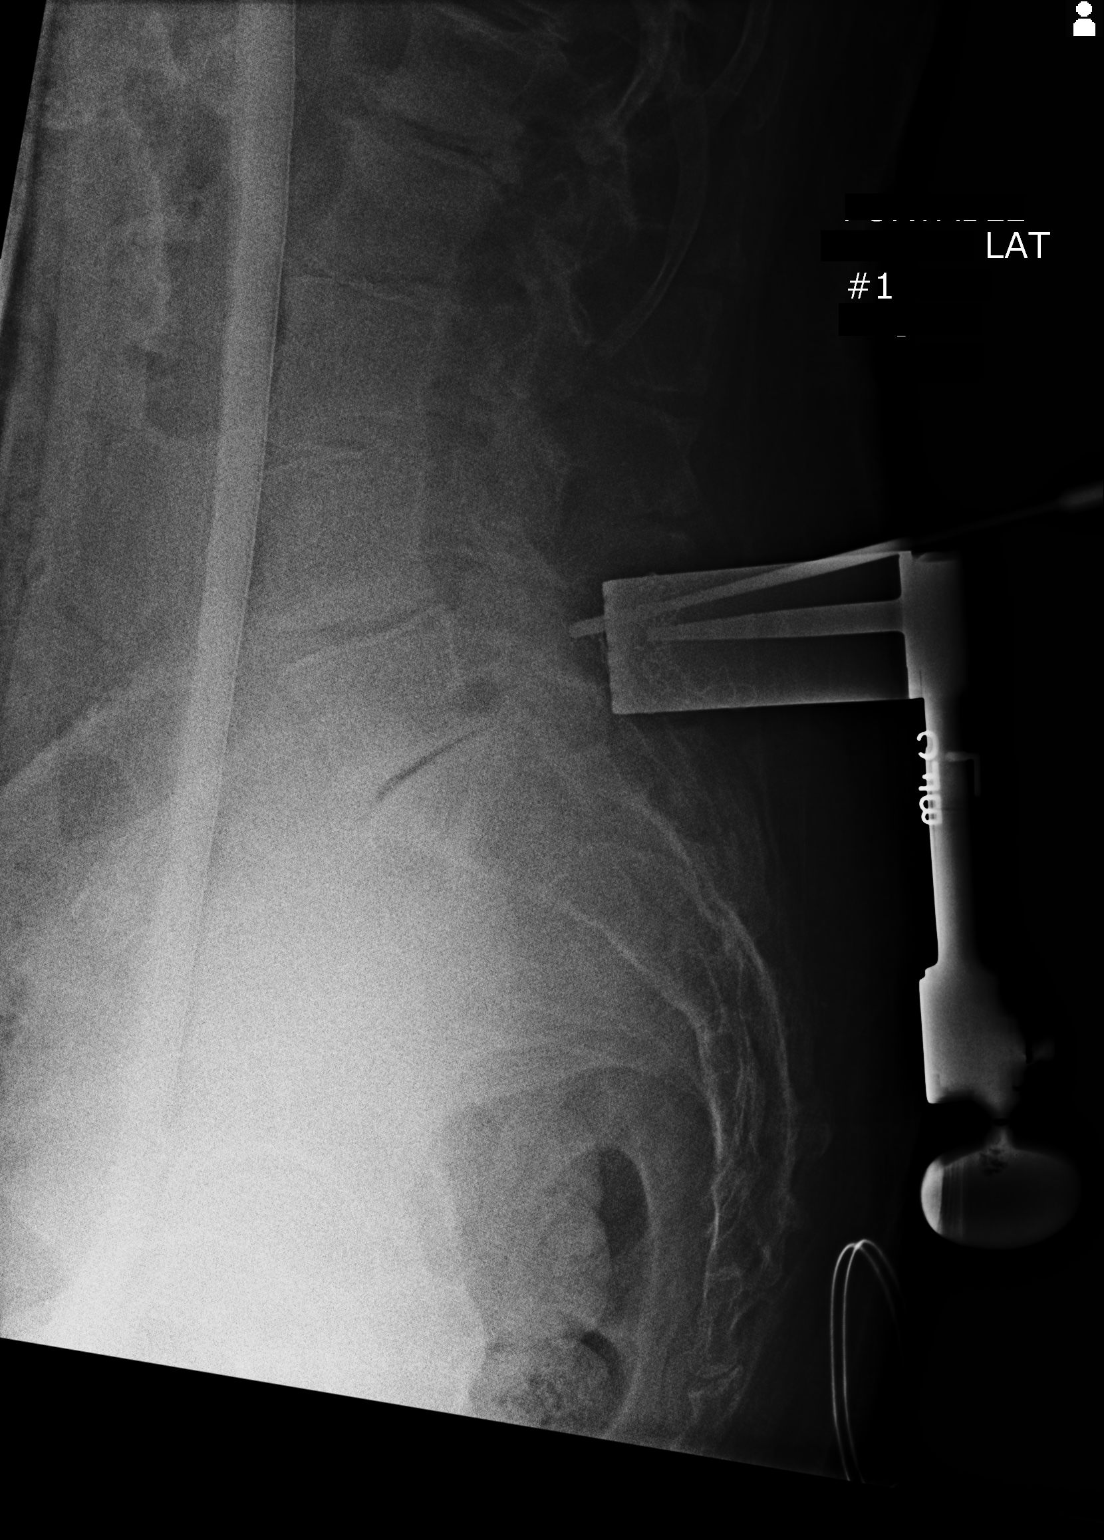

[1 of 1 positions shown; findings below may reference images not displayed]

FINDINGS: Intraoperative localization radiograph in the lateral projection
shows a probe dorsal to the pedicles of L5. This use is the same
numbering scheme as the prior MRI.
IMPRESSION: Intraoperative localization at L5.

## 2018-03-24 DEATH — deceased
